# Patient Record
Sex: Female | Born: 1985
Health system: Southern US, Community
[De-identification: ages and names within clinical notes are randomized; demographics above are authoritative.]

## PROBLEM LIST (undated history)

## (undated) DIAGNOSIS — D649 Anemia, unspecified: Secondary | ICD-10-CM

## (undated) DIAGNOSIS — Z789 Other specified health status: Secondary | ICD-10-CM

## (undated) HISTORY — PX: EYE SURGERY: SHX253

## (undated) HISTORY — DX: Anemia, unspecified: D64.9

---

## 2009-07-30 ENCOUNTER — Emergency Department (HOSPITAL_COMMUNITY): Admission: EM | Admit: 2009-07-30 | Discharge: 2009-07-30 | Payer: Self-pay | Admitting: Emergency Medicine

## 2011-01-19 ENCOUNTER — Emergency Department (HOSPITAL_COMMUNITY): Payer: Self-pay

## 2011-01-19 ENCOUNTER — Emergency Department (HOSPITAL_COMMUNITY)
Admission: EM | Admit: 2011-01-19 | Discharge: 2011-01-19 | Disposition: A | Discharge status: Home / Self Care | Payer: Self-pay | Attending: Emergency Medicine | Admitting: Emergency Medicine

## 2011-01-19 DIAGNOSIS — Y9366 Activity, soccer: Secondary | ICD-10-CM | POA: Insufficient documentation

## 2011-01-19 DIAGNOSIS — M79609 Pain in unspecified limb: Secondary | ICD-10-CM | POA: Insufficient documentation

## 2011-01-19 DIAGNOSIS — W19XXXA Unspecified fall, initial encounter: Secondary | ICD-10-CM | POA: Insufficient documentation

## 2011-01-19 DIAGNOSIS — S6390XA Sprain of unspecified part of unspecified wrist and hand, initial encounter: Secondary | ICD-10-CM | POA: Insufficient documentation

## 2012-12-11 ENCOUNTER — Encounter (HOSPITAL_COMMUNITY): Payer: Self-pay | Admitting: *Deleted

## 2012-12-11 ENCOUNTER — Inpatient Hospital Stay (HOSPITAL_COMMUNITY)
Admission: AD | Admit: 2012-12-11 | Discharge: 2012-12-11 | Disposition: A | Discharge status: Home / Self Care | Payer: Managed Care, Other (non HMO) | Source: Ambulatory Visit | Attending: Obstetrics & Gynecology | Admitting: Obstetrics & Gynecology

## 2012-12-11 DIAGNOSIS — T7840XA Allergy, unspecified, initial encounter: Secondary | ICD-10-CM | POA: Insufficient documentation

## 2012-12-11 DIAGNOSIS — R21 Rash and other nonspecific skin eruption: Secondary | ICD-10-CM | POA: Insufficient documentation

## 2012-12-11 HISTORY — DX: Other specified health status: Z78.9

## 2012-12-11 NOTE — MAU Provider Note (Signed)
  History     CSN: [id number]  Arrival date and time: 12/11/12 2152   First Provider Initiated Contact with Patient 12/11/12 2309      Chief Complaint  Patient presents with  . Allergic Reaction   HPI Julie Mayo is a 27 y.o. G0P0 who presents to MAU today with complaint of rash. The patient states that the rash started ~ 2 days ago. It is pruritic and found in small patches of red raised bumps on her arms, back and chest. She denies SOB or swelling of the lips, tongue or throat. She denies GYN concerns and has no fever or N/V. She has not taken anything to relieve the itch or rash.   OB History   Grav Para Term Preterm Abortions TAB SAB Ect Mult Living   0               Past Medical History  Diagnosis Date  . Medical history non-contributory     Past Surgical History  Procedure Laterality Date  . Eye surgery      Family History  Problem Relation Age of Onset  . Cancer Maternal Grandmother   . Cancer Paternal Grandmother   . Diabetes Paternal Grandmother     History  Substance Use Topics  . Smoking status: Current Some Day Smoker    Types: Cigarettes  . Smokeless tobacco: Not on file  . Alcohol Use: Yes     Comment: occ    Allergies: No Known Allergies  No prescriptions prior to admission    Review of Systems  Constitutional: Negative for fever, chills and malaise/fatigue.  Gastrointestinal: Negative for nausea, vomiting, abdominal pain, diarrhea and constipation.  Genitourinary: Negative for dysuria, urgency and frequency.  Skin: Positive for itching and rash.   Physical Exam   Blood pressure 116/70, pulse 98, resp. rate 20, height 5' 3.5" (1.613 m), weight 113 lb (51.256 kg), last menstrual period 12/03/2012, SpO2 100.00%.  Physical Exam  Constitutional: She is oriented to person, place, and time. She appears well-developed and well-nourished. No distress.  HENT:  Head: Normocephalic and atraumatic.  Cardiovascular: Normal rate.    Respiratory: Effort normal.  Neurological: She is alert and oriented to person, place, and time.  Skin: Skin is warm and dry. Rash noted. There is erythema.  Three small patches of very small raised bumps with minimal surrounding erythema. No edema. No weeping.   Psychiatric: She has a normal mood and affect.   MAU Course  Procedures None  MDM Rash has appearance of allergic reaction. Patient states changes in "body oils" and products as well as exposure to different plant items etc.. At work.   Assessment and Plan  A: Allergic skin reaction  P: Discharge home Recommended Benadryl, zyrtec or claritan and hydrocortisone cream PRN Patient encouraged to seek medical attention with local ED or PCP if symptoms worsen. Dermatology referral may be necessary if symptoms worsen.  Return to MAU as needed or if her condition were to change or worsen  Julie Starr, PA-C  12/11/2012, 11:09 PM

## 2012-12-11 NOTE — Progress Notes (Signed)
Has slightly raised, red rash sporadically on trunk, arms, neck. Itches sometimes.

## 2012-12-11 NOTE — Progress Notes (Signed)
Written and verbal d/c instructions given and understanding voiced. 

## 2012-12-11 NOTE — MAU Note (Addendum)
Tried new body oil few days ago and now have itching and redness on neck and trunk. Also come in contact with diff. Plants at work. Symptoms started Thursday

## 2013-01-10 ENCOUNTER — Other Ambulatory Visit (HOSPITAL_COMMUNITY)
Admission: RE | Admit: 2013-01-10 | Discharge: 2013-01-10 | Disposition: A | Discharge status: Home / Self Care | Payer: Managed Care, Other (non HMO) | Source: Ambulatory Visit | Attending: Family Medicine | Admitting: Family Medicine

## 2013-01-10 ENCOUNTER — Other Ambulatory Visit: Payer: Self-pay | Admitting: Family Medicine

## 2013-01-10 DIAGNOSIS — Z124 Encounter for screening for malignant neoplasm of cervix: Secondary | ICD-10-CM | POA: Insufficient documentation

## 2013-07-18 ENCOUNTER — Emergency Department (HOSPITAL_COMMUNITY): Payer: Managed Care, Other (non HMO)

## 2013-07-18 ENCOUNTER — Emergency Department (HOSPITAL_COMMUNITY)
Admission: EM | Admit: 2013-07-18 | Discharge: 2013-07-18 | Disposition: A | Discharge status: Home / Self Care | Payer: Managed Care, Other (non HMO) | Attending: Emergency Medicine | Admitting: Emergency Medicine

## 2013-07-18 ENCOUNTER — Encounter (HOSPITAL_COMMUNITY): Payer: Self-pay | Admitting: Emergency Medicine

## 2013-07-18 DIAGNOSIS — S93409A Sprain of unspecified ligament of unspecified ankle, initial encounter: Secondary | ICD-10-CM | POA: Insufficient documentation

## 2013-07-18 DIAGNOSIS — F17200 Nicotine dependence, unspecified, uncomplicated: Secondary | ICD-10-CM | POA: Insufficient documentation

## 2013-07-18 DIAGNOSIS — W219XXA Striking against or struck by unspecified sports equipment, initial encounter: Secondary | ICD-10-CM | POA: Insufficient documentation

## 2013-07-18 DIAGNOSIS — Y9239 Other specified sports and athletic area as the place of occurrence of the external cause: Secondary | ICD-10-CM | POA: Insufficient documentation

## 2013-07-18 DIAGNOSIS — S93402A Sprain of unspecified ligament of left ankle, initial encounter: Secondary | ICD-10-CM

## 2013-07-18 DIAGNOSIS — Y9366 Activity, soccer: Secondary | ICD-10-CM | POA: Insufficient documentation

## 2013-07-18 DIAGNOSIS — IMO0002 Reserved for concepts with insufficient information to code with codable children: Secondary | ICD-10-CM | POA: Insufficient documentation

## 2013-07-18 MED ORDER — IBUPROFEN 400 MG PO TABS
800.0000 mg | ORAL_TABLET | Freq: Once | ORAL | Status: AC
  Administered 2013-07-18: 800 mg via ORAL
  Filled 2013-07-18: qty 2

## 2013-07-18 MED ORDER — IBUPROFEN 800 MG PO TABS: 800.0000 mg | ORAL_TABLET | Freq: Three times a day (TID) | ORAL | Status: DC | PRN

## 2013-07-18 NOTE — ED Notes (Signed)
PA at bedside.

## 2013-07-18 NOTE — ED Notes (Signed)
Pt states she was playing soccer yesterday when she was cleated in the right ankle, pt states she is able to bare minimal weight on her right foot. Pt states she cannot rotate her ankle, cannot flex or extend her right foot, or move the foot side to side.

## 2013-07-19 NOTE — ED Provider Notes (Signed)
Medical screening examination/treatment/procedure(s) were performed by non-physician practitioner and as supervising physician I was immediately available for consultation/collaboration.  EKG Interpretation   None        Teddy Pena K Montel Vanderhoof-Rasch, MD 07/19/13 0705 

## 2013-07-19 NOTE — ED Provider Notes (Signed)
CSN: [id number]     Arrival date & time 07/18/13  2201 History   First MD Initiated Contact with Patient 07/18/13 2251     Chief Complaint  Patient presents with  . Ankle Injury   HPI  History provided by the patient. Patient is a 27 year old female who presents with complaints of right ankle pain and injury. Patient states that another soccer player slid into her right ankle with their cleats and since that time she has had pain and swelling. Accident occurred yesterday. Pain has been worse today with more stiffness. Pain is worse with movements and walking. Has been tried to rest her foot and elevate with ice. She has not used any medications for her symptoms. Denies any weakness or numbness to the foot. No other aggravating or alleviating factors. No other associated symptoms.    Past Medical History  Diagnosis Date  . Medical history non-contributory    Past Surgical History  Procedure Laterality Date  . Eye surgery     Family History  Problem Relation Age of Onset  . Cancer Maternal Grandmother   . Cancer Paternal Grandmother   . Diabetes Paternal Grandmother    History  Substance Use Topics  . Smoking status: Current Some Day Smoker    Types: Cigarettes  . Smokeless tobacco: Not on file  . Alcohol Use: Yes     Comment: occ   OB History   Grav Para Term Preterm Abortions TAB SAB Ect Mult Living   0              Review of Systems  Neurological: Negative for weakness and numbness.  All other systems reviewed and are negative.    Allergies  Review of patient's allergies indicates no known allergies.  Home Medications   Current Outpatient Rx  Name  Route  Sig  Dispense  Refill  . ibuprofen (ADVIL,MOTRIN) 800 MG tablet   Oral   Take 1 tablet (800 mg total) by mouth every 8 (eight) hours as needed for pain.   30 tablet   0    BP 111/68  Pulse 77  Temp(Src) 97.8 F (36.6 C) (Oral)  Resp 16  Ht 5' 4.5" (1.638 m)  Wt 113 lb (51.256 kg)  BMI 19.1 kg/m2   SpO2 99%  LMP 07/11/2013 Physical Exam  Nursing note and vitals reviewed. Constitutional: She is oriented to person, place, and time. She appears well-developed and well-nourished. No distress.  HENT:  Head: Normocephalic.  Eyes: Conjunctivae are normal.  Cardiovascular: Normal rate and regular rhythm.   Pulmonary/Chest: Effort normal and breath sounds normal. No respiratory distress. She has no wheezes. She has no rales.  Musculoskeletal: Normal range of motion.  Small abrasions over the lateral right ankle. No bleeding. Mild swelling around the lateral malleolus. No gross deformity. No pain over the medial malleolus. No proximal fibular tenderness. No pain over the proximal fifth metatarsal. Normal dorsal pedal pulses sensations, movements and toes and capillary refill.  Neurological: She is alert and oriented to person, place, and time.  Skin: Skin is warm and dry. No rash noted.  Psychiatric: She has a normal mood and affect. Her behavior is normal.    ED Course  Procedures  Patient seen and evaluated. Patient appears in mild discomfort no acute distress.    Dg Ankle Complete Right  07/18/2013   CLINICAL DATA:  Lateral ankle pain  EXAM: RIGHT ANKLE - COMPLETE 3+ VIEW  COMPARISON:  None.  FINDINGS: Negative for acute fracture or  malalignment. Corticated ossicle at the tip of the medial malleolus, likely from remote avulsion fracture. No joint narrowing.  IMPRESSION: Negative for acute osseous injury.   Electronically Signed   By: Tiburcio Pea M.D.   On: 07/18/2013 23:04    EKG Interpretation   None       MDM   1. Ankle sprain, left, initial encounter        Angus Seller, PA-C 07/19/13 [phone]

## 2013-08-05 ENCOUNTER — Emergency Department (HOSPITAL_COMMUNITY)
Admission: EM | Admit: 2013-08-05 | Discharge: 2013-08-06 | Disposition: A | Discharge status: Home / Self Care | Payer: Managed Care, Other (non HMO) | Attending: Emergency Medicine | Admitting: Emergency Medicine

## 2013-08-05 ENCOUNTER — Encounter (HOSPITAL_COMMUNITY): Payer: Self-pay | Admitting: Emergency Medicine

## 2013-08-05 DIAGNOSIS — N649 Disorder of breast, unspecified: Secondary | ICD-10-CM

## 2013-08-05 DIAGNOSIS — N6489 Other specified disorders of breast: Secondary | ICD-10-CM | POA: Insufficient documentation

## 2013-08-05 DIAGNOSIS — F17200 Nicotine dependence, unspecified, uncomplicated: Secondary | ICD-10-CM | POA: Insufficient documentation

## 2013-08-05 NOTE — ED Notes (Signed)
C/o rt breast pain for 2 days.  She just finished her period

## 2013-08-06 MED ORDER — IBUPROFEN 600 MG PO TABS: 600.0000 mg | ORAL_TABLET | Freq: Four times a day (QID) | ORAL | Status: DC | PRN

## 2013-08-06 MED ORDER — TRAMADOL HCL 50 MG PO TABS: 50.0000 mg | ORAL_TABLET | Freq: Four times a day (QID) | ORAL | Status: DC | PRN

## 2013-08-06 NOTE — ED Provider Notes (Signed)
CSN: [id number]     Arrival date & time 08/05/13  2351 History   First MD Initiated Contact with Patient 08/06/13 [phone]     Chief Complaint  Patient presents with  . Breast Pain   (Consider location/radiation/quality/duration/timing/severity/associated sxs/prior Treatment) HPI This patient is a generally healthy young woman who presents with complaints of a tender and painful sensation over the right nipple. She noticed pain this morning while she was at work. This afternoon, she noticed that her right nipple appear to be swollen. She has not had a nipple discharge. Pain is intermittent. It is mild to moderate in severity. The patient denies any associated tenderness of the right breast behind the nipple region.   Patient denies history of similar symptoms. She has not appreciated any breast tissue masses. No fever. Her last menstrual period began 5 days ago.  Past Medical History  Diagnosis Date  . Medical history non-contributory    Past Surgical History  Procedure Laterality Date  . Eye surgery     Family History  Problem Relation Age of Onset  . Cancer Maternal Grandmother   . Cancer Paternal Grandmother   . Diabetes Paternal Grandmother    History  Substance Use Topics  . Smoking status: Current Some Day Smoker    Types: Cigarettes  . Smokeless tobacco: Not on file  . Alcohol Use: Yes     Comment: occ   OB History   Grav Para Term Preterm Abortions TAB SAB Ect Mult Living   0              Review of Systems  10 POINT ROS OBTAINED AND IS NEGATIVE WITH THE EXCEPTION OF SX NOTED ABOVE.  Allergies  Review of patient's allergies indicates no known allergies.  Home Medications   Current Outpatient Rx  Name  Route  Sig  Dispense  Refill  . ibuprofen (ADVIL,MOTRIN) 600 MG tablet   Oral   Take 1 tablet (600 mg total) by mouth every 6 (six) hours as needed.   30 tablet   0   . ibuprofen (ADVIL,MOTRIN) 800 MG tablet   Oral   Take 1 tablet (800 mg total) by mouth  every 8 (eight) hours as needed for pain.   30 tablet   0   . traMADol (ULTRAM) 50 MG tablet   Oral   Take 1 tablet (50 mg total) by mouth every 6 (six) hours as needed.   15 tablet   0    BP 116/77  Pulse 99  Temp(Src) 97.2 F (36.2 C) (Oral)  Resp 16  Wt 110 lb 6.4 oz (50.077 kg)  SpO2 98%  LMP 08/01/2013 Physical Exam Gen: well developed and well nourished appearing Head: NCAT Eyes: PERL, EOMI Nose: no epistaixis or rhinorrhea Mouth/throat: mucosa is moist and pink Neck: Normal to inspection Lungs: CTA B, no wheezing, rhonchi or rales CV: RRR, no murmur Breast exam: no lesions appreciated within the breast tissue on either side, no axillary masses or LAN, there is swelling of the right nipple with very small serous appearing fluid collection over the superolatera aspect of the right nipple, no nipple discharge, inversion, puckering, the aerola is not involved. Left nipple and aerola are normal to inspection.  Abd: soft, notender Back: normal to inspection Skin: warm and dry Neuro: CN ii-xii grossly intact, no focal deficits, normal speech Psyche; normal affect,  calm and cooperative.   ED Course  Procedures (including critical care time)   MDM   1. Breast lesion  Patient with benign appearing right nipple lesion. I have advised that she follow up with Dr. Donell Beers for further evaluation. The patient has contact information with Dr. Donell Beers and will call Monday to schedule appt. There are no signs of infectious process at this time. We will manage with warm compresses and NSAID along with Tramadol for breakthrough pain. Patient advised re: return precautions. She also has a PCP with whom she may follow up.     Brandt Loosen, MD 08/06/13 [phone]

## 2013-08-06 NOTE — Discharge Instructions (Signed)
WARM COMPRESSES  PLEASE RETURN TO THE ED IF YOU DEVELOP REDNESS, TENDERNESS, EXPANSION OF SWELLING.

## 2013-10-05 ENCOUNTER — Emergency Department (HOSPITAL_COMMUNITY)
Admission: EM | Admit: 2013-10-05 | Discharge: 2013-10-05 | Disposition: A | Discharge status: Home / Self Care | Payer: Self-pay | Attending: Emergency Medicine | Admitting: Emergency Medicine

## 2013-10-05 ENCOUNTER — Encounter (HOSPITAL_COMMUNITY): Payer: Self-pay | Admitting: Emergency Medicine

## 2013-10-05 DIAGNOSIS — F411 Generalized anxiety disorder: Secondary | ICD-10-CM | POA: Insufficient documentation

## 2013-10-05 DIAGNOSIS — F419 Anxiety disorder, unspecified: Secondary | ICD-10-CM

## 2013-10-05 DIAGNOSIS — R1110 Vomiting, unspecified: Secondary | ICD-10-CM | POA: Insufficient documentation

## 2013-10-05 DIAGNOSIS — F17200 Nicotine dependence, unspecified, uncomplicated: Secondary | ICD-10-CM | POA: Insufficient documentation

## 2013-10-05 DIAGNOSIS — Z3202 Encounter for pregnancy test, result negative: Secondary | ICD-10-CM | POA: Insufficient documentation

## 2013-10-05 DIAGNOSIS — N3090 Cystitis, unspecified without hematuria: Secondary | ICD-10-CM | POA: Insufficient documentation

## 2013-10-05 LAB — URINALYSIS, ROUTINE W REFLEX MICROSCOPIC
Bilirubin Urine: NEGATIVE
GLUCOSE, UA: NEGATIVE mg/dL
HGB URINE DIPSTICK: NEGATIVE
KETONES UR: NEGATIVE mg/dL
Leukocytes, UA: NEGATIVE
Nitrite: POSITIVE — AB
PH: 7.5 (ref 5.0–8.0)
PROTEIN: NEGATIVE mg/dL
Specific Gravity, Urine: 1.013 (ref 1.005–1.030)
Urobilinogen, UA: 0.2 mg/dL (ref 0.0–1.0)

## 2013-10-05 LAB — POCT PREGNANCY, URINE: PREG TEST UR: NEGATIVE

## 2013-10-05 LAB — CBC WITH DIFFERENTIAL/PLATELET
BASOS ABS: 0.1 10*3/uL (ref 0.0–0.1)
Basophils Relative: 1 % (ref 0–1)
Eosinophils Absolute: 0.1 10*3/uL (ref 0.0–0.7)
Eosinophils Relative: 2 % (ref 0–5)
HEMATOCRIT: 41.6 % (ref 36.0–46.0)
Hemoglobin: 14.4 g/dL (ref 12.0–15.0)
LYMPHS PCT: 31 % (ref 12–46)
Lymphs Abs: 1.8 10*3/uL (ref 0.7–4.0)
MCH: 32.2 pg (ref 26.0–34.0)
MCHC: 34.6 g/dL (ref 30.0–36.0)
MCV: 93.1 fL (ref 78.0–100.0)
Monocytes Absolute: 0.5 10*3/uL (ref 0.1–1.0)
Monocytes Relative: 9 % (ref 3–12)
NEUTROS ABS: 3.3 10*3/uL (ref 1.7–7.7)
NEUTROS PCT: 58 % (ref 43–77)
PLATELETS: 278 10*3/uL (ref 150–400)
RBC: 4.47 MIL/uL (ref 3.87–5.11)
RDW: 11.6 % (ref 11.5–15.5)
WBC: 5.7 10*3/uL (ref 4.0–10.5)

## 2013-10-05 LAB — COMPREHENSIVE METABOLIC PANEL
ALBUMIN: 4.4 g/dL (ref 3.5–5.2)
ALT: 9 U/L (ref 0–35)
AST: 18 U/L (ref 0–37)
Alkaline Phosphatase: 56 U/L (ref 39–117)
BILIRUBIN TOTAL: 0.7 mg/dL (ref 0.3–1.2)
BUN: 8 mg/dL (ref 6–23)
CHLORIDE: 99 meq/L (ref 96–112)
CO2: 24 meq/L (ref 19–32)
Calcium: 9.5 mg/dL (ref 8.4–10.5)
Creatinine, Ser: 0.71 mg/dL (ref 0.50–1.10)
GFR calc Af Amer: >90 mL/min (ref >90)
Glucose, Bld: 82 mg/dL (ref 70–99)
POTASSIUM: 3.5 meq/L — AB (ref 3.7–5.3)
SODIUM: 136 meq/L — AB (ref 137–147)
Total Protein: 7.6 g/dL (ref 6.0–8.3)

## 2013-10-05 LAB — LIPASE, BLOOD: Lipase: 14 U/L (ref 11–59)

## 2013-10-05 LAB — URINE MICROSCOPIC-ADD ON

## 2013-10-05 MED ORDER — LORAZEPAM 1 MG PO TABS: 0.5000 mg | ORAL_TABLET | Freq: Three times a day (TID) | ORAL | Status: AC | PRN

## 2013-10-05 MED ORDER — SULFAMETHOXAZOLE-TRIMETHOPRIM 800-160 MG PO TABS: 1.0000 | ORAL_TABLET | Freq: Two times a day (BID) | ORAL | Status: DC

## 2013-10-05 MED ORDER — LORAZEPAM 1 MG PO TABS
1.0000 mg | ORAL_TABLET | Freq: Once | ORAL | Status: AC
  Administered 2013-10-05: 1 mg via ORAL
  Filled 2013-10-05: qty 1
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
## 2013-10-05 NOTE — Discharge Instructions (Signed)
Please follow up with your PCP regarding today's visit. You will need your Thyroid function checked as this can be a medical cause for anxiety. We are unable to test this in the emergency room.  Urinary Tract Infection Urinary tract infections (UTIs) can develop anywhere along your urinary tract. Your urinary tract is your body's drainage system for removing wastes and extra water. Your urinary tract includes two kidneys, two ureters, a bladder, and a urethra. Your kidneys are a pair of bean-shaped organs. Each kidney is about the size of your fist. They are located below your ribs, one on each side of your spine. CAUSES Infections are caused by microbes, which are microscopic organisms, including fungi, viruses, and bacteria. These organisms are so small that they can only be seen through a microscope. Bacteria are the microbes that most commonly cause UTIs. SYMPTOMS  Symptoms of UTIs may vary by age and gender of the patient and by the location of the infection. Symptoms in young women typically include a frequent and intense urge to urinate and a painful, burning feeling in the bladder or urethra during urination. Older women and men are more likely to be tired, shaky, and weak and have muscle aches and abdominal pain. A fever may mean the infection is in your kidneys. Other symptoms of a kidney infection include pain in your back or sides below the ribs, nausea, and vomiting. DIAGNOSIS To diagnose a UTI, your caregiver will ask you about your symptoms. Your caregiver also will ask to provide a urine sample. The urine sample will be tested for bacteria and white blood cells. White blood cells are made by your body to help fight infection. TREATMENT  Typically, UTIs can be treated with medication. Because most UTIs are caused by a bacterial infection, they usually can be treated with the use of antibiotics. The choice of antibiotic and length of treatment depend on your symptoms and the type of bacteria  causing your infection. HOME CARE INSTRUCTIONS  If you were prescribed antibiotics, take them exactly as your caregiver instructs you. Finish the medication even if you feel better after you have only taken some of the medication.  Drink enough water and fluids to keep your urine clear or pale yellow.  Avoid caffeine, tea, and carbonated beverages. They tend to irritate your bladder.  Empty your bladder often. Avoid holding urine for long periods of time.  Empty your bladder before and after sexual intercourse.  After a bowel movement, women should cleanse from front to back. Use each tissue only once. SEEK MEDICAL CARE IF:   You have back pain.  You develop a fever.  Your symptoms do not begin to resolve within 3 days. SEEK IMMEDIATE MEDICAL CARE IF:   You have severe back pain or lower abdominal pain.  You develop chills.  You have nausea or vomiting.  You have continued burning or discomfort with urination. MAKE SURE YOU:   Understand these instructions.  Will watch your condition.  Will get help right away if you are not doing well or get worse. Document Released: 06/18/2005 Document Revised: 03/09/2012 Document Reviewed: 10/17/2011 [facility] Patient Information 2014 [place], [place].  Panic Attacks Panic attacks are sudden, short-livedsurges of severe anxiety, fear, or discomfort. They may occur for no reason when you are relaxed, when you are anxious, or when you are sleeping. Panic attacks may occur for a number of reasons:   Healthy people occasionally have panic attacks in extreme, life-threatening situations, such as war or natural disasters.  Normal anxiety is a protective mechanism of the body that helps us react to danger (fight or flight response).  Panic attacks are often seen with anxiety disorders, such as panic disorder, social anxiety disorder, generalized anxiety disorder, and phobias. Anxiety disorders cause excessive or uncontrollable anxiety. They may  interfere with your relationships or other life activities.  Panic attacks are sometimes seen with other mental illnesses such as depression and posttraumatic stress disorder.  Certain medical conditions, prescription medicines, and drugs of abuse can cause panic attacks. SYMPTOMS  Panic attacks start suddenly, peak within 20 minutes, and are accompanied by four or more of the following symptoms:  Pounding heart or fast heart rate (palpitations).  Sweating.  Trembling or shaking.  Shortness of breath or feeling smothered.  Feeling choked.  Chest pain or discomfort.  Nausea or strange feeling in your stomach.  Dizziness, lightheadedness, or feeling like you will faint.  Chills or hot flushes.  Numbness or tingling in your lips or hands and feet.  Feeling that things are not real or feeling that you are not yourself.  Fear of losing control or going crazy.  Fear of dying. Some of these symptoms can mimic serious medical conditions. For example, you may think you are having a heart attack. Although panic attacks can be very scary, they are not life threatening. DIAGNOSIS  Panic attacks are diagnosed through an assessment by your health care provider. Your health care provider will ask questions about your symptoms, such as where and when they occurred. Your health care provider will also ask about your medical history and use of alcohol and drugs, including prescription medicines. Your health care provider may order blood tests or other studies to rule out a serious medical condition. Your health care provider may refer you to a mental health professional for further evaluation. TREATMENT   Most healthy people who have one or two panic attacks in an extreme, life-threatening situation will not require treatment.  The treatment for panic attacks associated with anxiety disorders or other mental illness typically involves counseling with a mental health professional, medicine, or a  combination of both. Your health care provider will help determine what treatment is best for you.  Panic attacks due to physical illness usually goes away with treatment of the illness. If prescription medicine is causing panic attacks, talk with your health care provider about stopping the medicine, decreasing the dose, or substituting another medicine.  Panic attacks due to alcohol or drug abuse goes away with abstinence. Some adults need professional help in order to stop drinking or using drugs. HOME CARE INSTRUCTIONS   Take all your medicines as prescribed.   Check with your health care provider before starting new prescription or over-the-counter medicines.  Keep all follow up appointments with your health care provider. SEEK MEDICAL CARE IF:  You are not able to take your medicines as prescribed.  Your symptoms do not improve or get worse. SEEK IMMEDIATE MEDICAL CARE IF:   You experience panic attack symptoms that are different than your usual symptoms.  You have serious thoughts about hurting yourself or others.  You are taking medicine for panic attacks and have a serious side effect. MAKE SURE YOU:  Understand these instructions.  Will watch your condition.  Will get help right away if you are not doing well or get worse. Document Released: 09/08/2005 Document Revised: 06/29/2013 Document Reviewed: 04/22/2013 [facility] Patient Information 2014 [place], [place].

## 2013-10-05 NOTE — ED Notes (Signed)
Pt expressing thoughts of anxiety and nausea. See triage. Denies pain and vomiting

## 2013-10-08 NOTE — ED Provider Notes (Signed)
Medical screening examination/treatment/procedure(s) were performed by non-physician practitioner and as supervising physician I was immediately available for consultation/collaboration.  EKG Interpretation   None         Sherena Machorro, MD 10/08/13 0715 

## 2013-12-02 ENCOUNTER — Other Ambulatory Visit: Payer: Self-pay | Admitting: Family Medicine

## 2013-12-02 ENCOUNTER — Other Ambulatory Visit (HOSPITAL_COMMUNITY)
Admission: RE | Admit: 2013-12-02 | Discharge: 2013-12-02 | Disposition: A | Discharge status: Home / Self Care | Payer: 59 | Source: Ambulatory Visit | Attending: Family Medicine | Admitting: Family Medicine

## 2013-12-02 DIAGNOSIS — Z113 Encounter for screening for infections with a predominantly sexual mode of transmission: Secondary | ICD-10-CM | POA: Insufficient documentation

## 2013-12-02 DIAGNOSIS — Z124 Encounter for screening for malignant neoplasm of cervix: Secondary | ICD-10-CM | POA: Insufficient documentation

## 2014-07-18 ENCOUNTER — Encounter: Payer: Self-pay | Admitting: Gastroenterology

## 2014-08-31 ENCOUNTER — Encounter: Payer: Self-pay | Admitting: Gastroenterology

## 2014-09-19 ENCOUNTER — Ambulatory Visit (INDEPENDENT_AMBULATORY_CARE_PROVIDER_SITE_OTHER): Payer: 59 | Admitting: Gastroenterology

## 2014-09-19 ENCOUNTER — Encounter: Payer: Self-pay | Admitting: Gastroenterology

## 2014-09-19 ENCOUNTER — Other Ambulatory Visit (INDEPENDENT_AMBULATORY_CARE_PROVIDER_SITE_OTHER): Payer: 59

## 2014-09-19 VITALS — BP 90/60 | HR 60 | Ht 63.0 in | Wt 118.4 lb

## 2014-09-19 DIAGNOSIS — K5900 Constipation, unspecified: Secondary | ICD-10-CM

## 2014-09-19 LAB — CBC WITH DIFFERENTIAL/PLATELET
BASOS ABS: 0 10*3/uL (ref 0.0–0.1)
Basophils Relative: 0.9 % (ref 0.0–3.0)
EOS ABS: 0.1 10*3/uL (ref 0.0–0.7)
Eosinophils Relative: 2.9 % (ref 0.0–5.0)
HCT: 40 % (ref 36.0–46.0)
Hemoglobin: 13.1 g/dL (ref 12.0–15.0)
LYMPHS ABS: 1.3 10*3/uL (ref 0.7–4.0)
LYMPHS PCT: 26.5 % (ref 12.0–46.0)
MCHC: 32.8 g/dL (ref 30.0–36.0)
MCV: 94.8 fl (ref 78.0–100.0)
MONOS PCT: 5.9 % (ref 3.0–12.0)
Monocytes Absolute: 0.3 10*3/uL (ref 0.1–1.0)
Neutro Abs: 3.2 10*3/uL (ref 1.4–7.7)
Neutrophils Relative %: 63.8 % (ref 43.0–77.0)
PLATELETS: 241 10*3/uL (ref 150.0–400.0)
RBC: 4.22 Mil/uL (ref 3.87–5.11)
RDW: 12.5 % (ref 11.5–15.5)
WBC: 5 10*3/uL (ref 4.0–10.5)

## 2014-09-19 LAB — COMPREHENSIVE METABOLIC PANEL
ALT: 9 U/L (ref 0–35)
AST: 18 U/L (ref 0–37)
Albumin: 3.9 g/dL (ref 3.5–5.2)
Alkaline Phosphatase: 41 U/L (ref 39–117)
BILIRUBIN TOTAL: 1.1 mg/dL (ref 0.2–1.2)
BUN: 8 mg/dL (ref 6–23)
CHLORIDE: 106 meq/L (ref 96–112)
CO2: 27 meq/L (ref 19–32)
Calcium: 8.7 mg/dL (ref 8.4–10.5)
Creatinine, Ser: 0.7 mg/dL (ref 0.4–1.2)
GFR: 133.94 mL/min (ref >60.00)
Glucose, Bld: 80 mg/dL (ref 70–99)
Potassium: 3.8 mEq/L (ref 3.5–5.1)
SODIUM: 138 meq/L (ref 135–145)
TOTAL PROTEIN: 6.5 g/dL (ref 6.0–8.3)

## 2014-09-19 LAB — TSH: TSH: 0.62 u[IU]/mL (ref 0.35–4.50)

## 2014-09-19 NOTE — Patient Instructions (Addendum)
One of your biggest health concerns is your smoking.  This increases your risk for most cancers and serious cardiovascular diseases such as strokes, heart attacks.  You should try your best to stop.  If you need assistance, please contact your PCP or Smoking Cessation Class at [facility] [phone]) or [facility] (1-800-QUIT-NOW). Please start taking citrucel (orange flavored) powder fiber supplement.  This may cause some bloating at first but that usually goes away. Begin with a small spoonful and work your way up to a large, heaping spoonful daily over a week. You will have labs checked today in the basement lab.  Please head down after you check out with the front desk  (cbc, cmet, tsh, celiac panel). You will be set up for a flexible sigmoidoscopy for your constipation.

## 2014-09-19 NOTE — Progress Notes (Signed)
HPI: This is a   very pleasant 28 year old woman whom I am meeting for the first time today.  abd pains perimbilical.  Constipated all her life.  Probably worse in past couple years.  Has to really push and strain, takes a long time to pass stool, can be painful. Can see blood in her stools.    She tried fiber gummies, for couple days, no real improvement.  Overall her weight is up a bit recently.  Brother with celiac sprue. She believes biopsy confirmed  Review of systems: Pertinent positive and negative review of systems were noted in the above HPI section. Complete review of systems was performed and was otherwise normal.    Past Medical History  Diagnosis Date  . Medical history non-contributory     Past Surgical History  Procedure Laterality Date  . Eye surgery      Current Outpatient Prescriptions  Medication Sig Dispense Refill  . ibuprofen (ADVIL,MOTRIN) 200 MG tablet Take 200 mg by mouth every 6 (six) hours as needed.    Marland Kitchen. LORazepam (ATIVAN) 1 MG tablet Take 0.5-1 tablets (0.5-1 mg total) by mouth 3 (three) times daily as needed for anxiety. 10 tablet 0   No current facility-administered medications for this visit.    Allergies as of 09/19/2014  . (No Known Allergies)    Family History  Problem Relation Age of Onset  . Cancer Maternal Grandmother   . Cancer Paternal Grandmother   . Diabetes Paternal Grandmother   . Colon cancer Paternal Grandmother   . Celiac disease Brother     History   Social History  . Marital Status: Single    Spouse Name: N/A    Number of Children: N/A  . Years of Education: N/A   Occupational History  . [employer]    Social History Main Topics  . Smoking status: Current Some Day Smoker    Types: Cigarettes  . Smokeless tobacco: Never Used  . Alcohol Use: 0.0 oz/week    0 Not specified per week     Comment: occ  . Drug Use: No  . Sexual Activity: Yes    Birth Control/ Protection: None   Other Topics Concern  . Not on  file   Social History Narrative       Physical Exam: BP 90/60 mmHg  Pulse 60  Ht 5\' 3"  (1.6 m)  Wt 118 lb 6.4 oz (53.706 kg)  BMI 20.98 kg/m2  LMP 09/11/2014 (Approximate) Constitutional: generally well-appearing Psychiatric: alert and oriented x3 Eyes: extraocular movements intact Mouth: oral pharynx moist, no lesions Neck: supple no lymphadenopathy Cardiovascular: heart regular rate and rhythm Lungs: clear to auscultation bilaterally Abdomen: soft, nontender, nondistended, no obvious ascites, no peritoneal signs, normal bowel sounds Extremities: no lower extremity edema bilaterally Skin: no lesions on visible extremities    Assessment and plan: 28 y.o. female with  chronic constipation, lower abdominal pains that improved with bowel movements  I do suspect that her abdominal discomforts are related to her chronic constipation. She has never really given fiber supplements to try longer than a day or 2. She is going to start Citrucel fiber supplementation. She will have a basic set of labs including CBC, complete metabolic profile, thyroid testing. I am also going to check celiac panel since her brother has celiac sprue and she has quite a lot of difficulty gaining weight. Lastly she will undergo flexible sigmoidoscopy to rule out structural causes which I think are unlikely but since she does see intermittent blood  I think we should proceed.

## 2014-09-20 LAB — CELIAC PANEL 10
Endomysial Screen: NEGATIVE
GLIADIN IGA: 3 U (ref <20)
GLIADIN IGG: 2 U (ref <20)
IGA: 150 mg/dL (ref 69–380)
TISSUE TRANSGLUTAMINASE AB, IGA: 1 U/mL (ref <4)
Tissue Transglut Ab: 1 U/mL (ref <6)

## 2014-10-31 ENCOUNTER — Encounter: Payer: Self-pay | Admitting: Gastroenterology

## 2014-10-31 ENCOUNTER — Ambulatory Visit (AMBULATORY_SURGERY_CENTER): Payer: 59 | Admitting: Gastroenterology

## 2014-10-31 VITALS — BP 115/70 | HR 69 | Temp 98.3°F | Resp 26 | Ht 63.0 in | Wt 118.0 lb

## 2014-10-31 DIAGNOSIS — K5900 Constipation, unspecified: Secondary | ICD-10-CM

## 2014-10-31 MED ORDER — SODIUM CHLORIDE 0.9 % IV SOLN: 500.0000 mL | INTRAVENOUS | Status: DC

## 2014-10-31 NOTE — Progress Notes (Signed)
Report to PACU, RN, vss, BBS= Clear.  

## 2014-10-31 NOTE — Op Note (Signed)
[facility] [address] [place], [id number]   FLEX SIGMOIDOSCOPY PROCEDURE REPORT  PATIENT: Julie Mayo, Julie Mayo  MR#: [id number] BIRTHDATE: [birth year] , 28  yrs. old GENDER: female ENDOSCOPIST: Rachael Feeaniel P Laterra Lubinski, MD REFERRED HQ:IONGEBY:Dibas Koirala, MD PROCEDURE DATE:  10/31/2014 PROCEDURE:   Sigmoidoscopy, diagnostic INDICATIONS:constipation. MEDICATIONS: Monitored anesthesia care and Propofol 200 mg IV  DESCRIPTION OF PROCEDURE:    Physical exam was performed.  Informed consent was obtained from the patient after explaining the benefits, risks, and alternatives to procedure.  The patient was connected to monitor and placed in left lateral position. Continuous oxygen was provided by nasal cannula and IV medicine administered through an indwelling cannula.  After administration of sedation and rectal exam, the patients rectum was intubated and the LB PFC-[id number]  colonoscope was advanced under direct visualization to the cecum.  The scope was removed slowly by carefully examining the color, texture, anatomy, and integrity mucosa on the way out.  The patient was recovered in endoscopy and discharged home in satisfactory condition.    COLON FINDINGS: Normal examination to the splenic flexure. PREP QUALITY: The overall prep quality was adequate. COMPLICATIONS: None  ENDOSCOPIC IMPRESSION: Normal examination to the splenic flexure No polyps or cancers  RECOMMENDATIONS: Restart/continue once daily fiber supplement (fiber gummies).  Try to stay hydrated.    _______________________________ eSigned:  Rachael Feeaniel P Xochilth Standish, MD 10/31/2014 3:20 PM      The ICD and CPT codes recommended by this software are interpretations from the data that the clinical staff has captured with the software.  The verification of the translation of this report to the ICD and CPT codes and modifiers is the sole responsibility of the health care institution and practicing physician  where this report was generated.  [employer] will not be held responsible for the validity of the ICD and CPT codes included on this report.  AMA assumes no liability for data contained or not contained herein. CPT is a Publishing rights managerregistered trademark of the [employer].

## 2014-10-31 NOTE — Patient Instructions (Signed)
Discharge instructions given. Normal exam. Resume previous medications. YOU HAD AN ENDOSCOPIC PROCEDURE TODAY AT THE [facility]: Refer to the procedure report that was given to you for any specific questions about what was found during the examination.  If the procedure report does not answer your questions, please call your gastroenterologist to clarify.  If you requested that your care partner not be given the details of your procedure findings, then the procedure report has been included in a sealed envelope for you to review at your convenience later.  YOU SHOULD EXPECT: Some feelings of bloating in the abdomen. Passage of more gas than usual.  Walking can help get rid of the air that was put into your GI tract during the procedure and reduce the bloating. If you had a lower endoscopy (such as a colonoscopy or flexible sigmoidoscopy) you may notice spotting of blood in your stool or on the toilet paper. If you underwent a bowel prep for your procedure, then you may not have a normal bowel movement for a few days.  DIET: Your first meal following the procedure should be a light meal and then it is ok to progress to your normal diet.  A half-sandwich or bowl of soup is an example of a good first meal.  Heavy or fried foods are harder to digest and may make you feel nauseous or bloated.  Likewise meals heavy in dairy and vegetables can cause extra gas to form and this can also increase the bloating.  Drink plenty of fluids but you should avoid alcoholic beverages for 24 hours.  ACTIVITY: Your care partner should take you home directly after the procedure.  You should plan to take it easy, moving slowly for the rest of the day.  You can resume normal activity the day after the procedure however you should NOT DRIVE or use heavy machinery for 24 hours (because of the sedation medicines used during the test).    SYMPTOMS TO REPORT IMMEDIATELY: A gastroenterologist can be reached at any hour.   During normal business hours, 8:30 AM to 5:00 PM Monday through Friday, call [phone].  After hours and on weekends, please call the GI answering service at [phone] who will take a message and have the physician on call contact you.   Following lower endoscopy (colonoscopy or flexible sigmoidoscopy):  Excessive amounts of blood in the stool  Significant tenderness or worsening of abdominal pains  Swelling of the abdomen that is new, acute  Fever of 100F or higher  FOLLOW UP: If any biopsies were taken you will be contacted by phone or by letter within the next 1-3 weeks.  Call your gastroenterologist if you have not heard about the biopsies in 3 weeks.  Our staff will call the home number listed on your records the next business day following your procedure to check on you and address any questions or concerns that you may have at that time regarding the information given to you following your procedure. This is a courtesy call and so if there is no answer at the home number and we have not heard from you through the emergency physician on call, we will assume that you have returned to your regular daily activities without incident.  SIGNATURES/CONFIDENTIALITY: You and/or your care partner have signed paperwork which will be entered into your electronic medical record.  These signatures attest to the fact that that the information above on your After Visit Summary has been reviewed and is understood.    Full responsibility of the confidentiality of this discharge information lies with you and/or your care-partner. 

## 2014-11-01 ENCOUNTER — Telehealth: Payer: Self-pay | Admitting: *Deleted

## 2014-11-01 NOTE — Telephone Encounter (Signed)
  Follow up Call-  Call back number 10/31/2014  Post procedure Call Back phone  # [phone]  Permission to leave phone message Yes   [facility]

## 2017-01-06 ENCOUNTER — Other Ambulatory Visit: Payer: Self-pay | Admitting: Family Medicine

## 2017-01-06 ENCOUNTER — Other Ambulatory Visit (HOSPITAL_COMMUNITY)
Admission: RE | Admit: 2017-01-06 | Discharge: 2017-01-06 | Disposition: A | Discharge status: Home / Self Care | Payer: 59 | Source: Ambulatory Visit | Attending: Family Medicine | Admitting: Family Medicine

## 2017-01-06 DIAGNOSIS — Z1151 Encounter for screening for human papillomavirus (HPV): Secondary | ICD-10-CM | POA: Insufficient documentation

## 2017-01-06 DIAGNOSIS — Z113 Encounter for screening for infections with a predominantly sexual mode of transmission: Secondary | ICD-10-CM | POA: Insufficient documentation

## 2017-01-06 DIAGNOSIS — Z01419 Encounter for gynecological examination (general) (routine) without abnormal findings: Secondary | ICD-10-CM | POA: Diagnosis present

## 2017-01-09 LAB — CYTOLOGY - PAP
CHLAMYDIA, DNA PROBE: NEGATIVE
DIAGNOSIS: NEGATIVE
HPV: NOT DETECTED
Neisseria Gonorrhea: NEGATIVE

## 2019-12-30 ENCOUNTER — Other Ambulatory Visit (HOSPITAL_COMMUNITY)
Admission: RE | Admit: 2019-12-30 | Discharge: 2019-12-30 | Disposition: A | Discharge status: Home / Self Care | Payer: 59 | Source: Ambulatory Visit | Attending: Family Medicine | Admitting: Family Medicine

## 2019-12-30 ENCOUNTER — Other Ambulatory Visit: Payer: Self-pay | Admitting: Family Medicine

## 2019-12-30 DIAGNOSIS — Z01411 Encounter for gynecological examination (general) (routine) with abnormal findings: Secondary | ICD-10-CM | POA: Diagnosis present

## 2020-01-04 LAB — CYTOLOGY - PAP
Comment: NEGATIVE
Diagnosis: NEGATIVE
High risk HPV: NEGATIVE

## 2020-04-19 ENCOUNTER — Other Ambulatory Visit: Payer: Self-pay

## 2020-04-19 ENCOUNTER — Ambulatory Visit (INDEPENDENT_AMBULATORY_CARE_PROVIDER_SITE_OTHER): Payer: 59

## 2020-04-19 ENCOUNTER — Ambulatory Visit (HOSPITAL_COMMUNITY)
Admission: EM | Admit: 2020-04-19 | Discharge: 2020-04-19 | Disposition: A | Discharge status: Home / Self Care | Payer: 59

## 2020-04-19 ENCOUNTER — Encounter (HOSPITAL_COMMUNITY): Payer: Self-pay | Admitting: Emergency Medicine

## 2020-04-19 DIAGNOSIS — M25532 Pain in left wrist: Secondary | ICD-10-CM | POA: Diagnosis not present

## 2020-04-19 DIAGNOSIS — W19XXXA Unspecified fall, initial encounter: Secondary | ICD-10-CM

## 2020-04-19 DIAGNOSIS — S63502A Unspecified sprain of left wrist, initial encounter: Secondary | ICD-10-CM | POA: Diagnosis not present

## 2020-04-19 MED ORDER — IBUPROFEN 800 MG PO TABS: 800.0000 mg | ORAL_TABLET | Freq: Once | ORAL | Status: DC

## 2020-04-19 MED ORDER — IBUPROFEN 800 MG PO TABS
ORAL_TABLET | ORAL | Status: AC
  Filled 2020-04-19: qty 1

## 2020-04-19 NOTE — ED Provider Notes (Signed)
[facility]    CSN: [id number] Arrival date & time: 04/19/20  9833      History   Chief Complaint Chief Complaint  Patient presents with  . Fall  . Wrist Injury    HPI Julie Mayo is a 34 y.o. female.   34 yr old AA female presents to UC today w cc of left lateral wrist pain after FOOSH injury,playing soccer yesterday. Used ice to area, decreased ROM, d/t pain  The history is provided by the patient. No language interpreter was used.    Past Medical History:  Diagnosis Date  . Anemia   . Medical history non-contributory     Patient Active Problem List   Diagnosis Date Noted  . Left wrist sprain, initial encounter 04/19/2020  . Fall 04/19/2020    Past Surgical History:  Procedure Laterality Date  . EYE SURGERY      OB History    Gravida  0   Para      Term      Preterm      AB      Living        SAB      TAB      Ectopic      Multiple      Live Births               Home Medications    Prior to Admission medications   Medication Sig Start Date End Date Taking? Authorizing Provider  ALPRAZolam (XANAX PO) Take by mouth.    [provider]  ibuprofen (ADVIL,MOTRIN) 200 MG tablet Take 200 mg by mouth every 6 (six) hours as needed.    [provider]  LORazepam (ATIVAN) 1 MG tablet Take 0.5-1 tablets (0.5-1 mg total) by mouth 3 (three) times daily as needed for anxiety. 10/05/13   Arthor Captain, PA-C    Family History Family History  Problem Relation Age of Onset  . Cancer Maternal Grandmother   . Cancer Paternal Grandmother   . Diabetes Paternal Grandmother   . Colon cancer Paternal Grandmother   . Celiac disease Brother   . Stomach cancer Neg Hx     Social History Social History   Tobacco Use  . Smoking status: Current Some Day Smoker    Packs/day: 0.25    Types: Cigarettes  . Smokeless tobacco: Never Used  Substance Use Topics  . Alcohol use: Yes    Alcohol/week: 0.0 standard drinks     Comment: occ  . Drug use: No     Allergies   Patient has no known allergies.   Review of Systems Review of Systems  Constitutional: Positive for activity change. Negative for fever.  HENT: Negative.   Eyes: Negative.   Respiratory: Negative for shortness of breath.   Cardiovascular: Negative for chest pain.  Gastrointestinal: Negative for abdominal pain.  Endocrine: Negative.   Genitourinary: Negative.   Musculoskeletal: Positive for joint swelling and myalgias. Negative for back pain and gait problem.  Skin: Negative for color change and wound.  Neurological: Negative for headaches.  Hematological: Negative.   Psychiatric/Behavioral: Negative.   All other systems reviewed and are negative.    Physical Exam Triage Vital Signs ED Triage Vitals  Enc Vitals Group     BP 04/19/20 1110 105/75     Pulse Rate 04/19/20 1110 73     Resp 04/19/20 1110 18     Temp 04/19/20 1110 98.7 F (37.1 C)     Temp Source  04/19/20 1110 Oral     SpO2 04/19/20 1110 100 %     Weight --      Height --      Head Circumference --      Peak Flow --      Pain Score 04/19/20 1105 6     Pain Loc --      Pain Edu? --      Excl. in GC? --    No data found.  Updated Vital Signs BP 105/75 (BP Location: Right Arm)   Pulse 73   Temp 98.7 F (37.1 C) (Oral)   Resp 18   LMP 03/20/2020   SpO2 100%    Physical Exam Vitals and nursing note reviewed.  Constitutional:      General: She is not in acute distress.    Appearance: She is well-developed. She is not ill-appearing or toxic-appearing.  HENT:     Head: Normocephalic.     Right Ear: Tympanic membrane normal.     Left Ear: Tympanic membrane normal.     Nose: Nose normal.     Mouth/Throat:     Pharynx: Uvula midline.  Eyes:     Pupils: Pupils are equal, round, and reactive to light.  Neck:     Trachea: Trachea normal.     Meningeal: Brudzinski's sign and Kernig's sign absent.  Cardiovascular:     Rate and Rhythm: Normal rate and  regular rhythm.  Pulmonary:     Effort: Pulmonary effort is normal.     Breath sounds: Normal breath sounds.  Musculoskeletal:     Right shoulder: No swelling, deformity, effusion, laceration, bony tenderness or crepitus. Normal range of motion. Normal strength. Normal pulse.     Left wrist: Swelling, tenderness and bony tenderness present. No deformity or snuff box tenderness. Decreased range of motion.     Cervical back: Normal range of motion. No muscular tenderness.     Comments: C/o left lateral wrist pain, +TTP , deceased ROM d/t pain  Skin:    General: Skin is warm and dry.     Findings: No rash.  Neurological:     Mental Status: She is alert and oriented to person, place, and time.     GCS: GCS eye subscore is 4. GCS verbal subscore is 5. GCS motor subscore is 6.  Psychiatric:        Speech: Speech normal.        Behavior: Behavior normal.      UC Treatments / Results  Labs (all labs ordered are listed, but only abnormal results are displayed) Labs Reviewed - No data to display  EKG   Radiology DG Wrist Complete Left  Result Date: 04/19/2020 CLINICAL DATA:  Pain following fall EXAM: LEFT WRIST - COMPLETE 3+ VIEW COMPARISON:  None. FINDINGS: Frontal, oblique, lateral, and ulnar deviation scaphoid images were obtained. No fracture or dislocation. Joint spaces appear normal. No erosive change. IMPRESSION: No fracture or dislocation.  No evident arthropathy. Electronically Signed   By: Bretta Bang III M.D.   On: 04/19/2020 11:30    Procedures Procedures (including critical care time)  Medications Ordered in UC Medications  ibuprofen (ADVIL) tablet 800 mg (800 mg Oral Not Given 04/19/20 1204)    Initial Impression / Assessment and Plan / UC Course  I have reviewed the triage vital signs and the nursing notes.  Pertinent labs & imaging results that were available during my care of the patient were reviewed by me and considered in my  medical decision making (see  chart for details).     Rest,ice,elevate,wear cock up velcro wrist splint.  May take tylenol,ibuporfen as label directed for discomfort. Follow up with Ortho in 1 week if symptoms persist  Final Clinical Impressions(s) / UC Diagnoses   Final diagnoses:  Left wrist sprain, initial encounter  Fall, initial encounter     Discharge Instructions      Rest,ice,eelvate,wear cock up velcro wrist splitn. May take tylenol,ibuporfen as label directed for discomfort. Follow up with Ortho in 1 week if symptoms persist    ED Prescriptions    None     PDMP not reviewed this encounter.   Clancy Gourd, NP 04/19/20 1243

## 2020-04-19 NOTE — Discharge Instructions (Addendum)
  Rest,ice,eelvate,wear cock up velcro wrist splitn. May take tylenol,ibuporfen as label directed for discomfort. Follow up with Ortho in 1 week if symptoms persist

## 2020-04-19 NOTE — ED Triage Notes (Signed)
Patient fell playing soccer last night.  Patient reports putting left hand out to stop fall.   pain in left wrist.  Able to move fingers, unable to rotate forearm.  Patient has an old injury to left forearm.   Left radial pulse 2+.  Patient has been using ice on left wrist

## 2020-04-19 NOTE — ED Notes (Signed)
Patient went to move her car.  Not available when called to intake

## 2022-01-29 IMAGING — DX DG WRIST COMPLETE 3+V*L*
4 series · 4 of 4 positions shown · non-contrast
Comparison: None.

CLINICAL DATA: Pain following fall

EXAM:
LEFT WRIST - COMPLETE 3+ VIEW

[wrist pa]
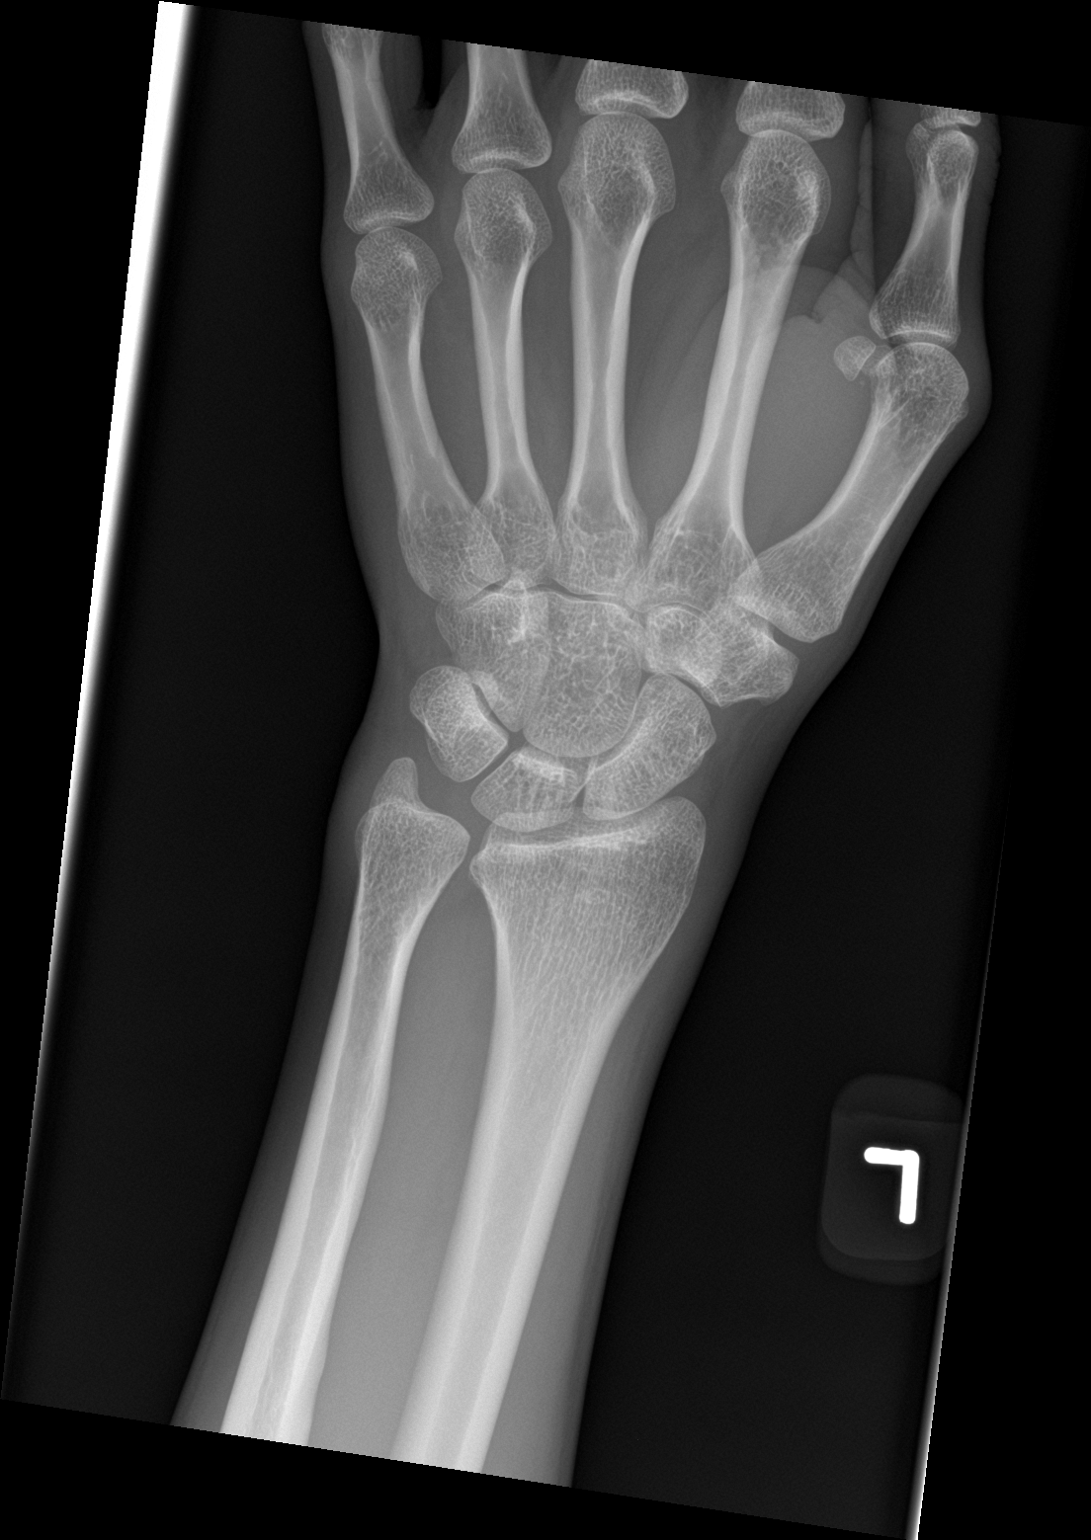

[wrist navicular]
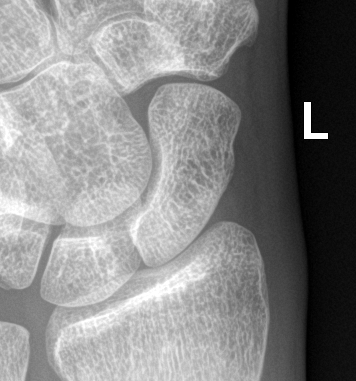

[wrist obl]
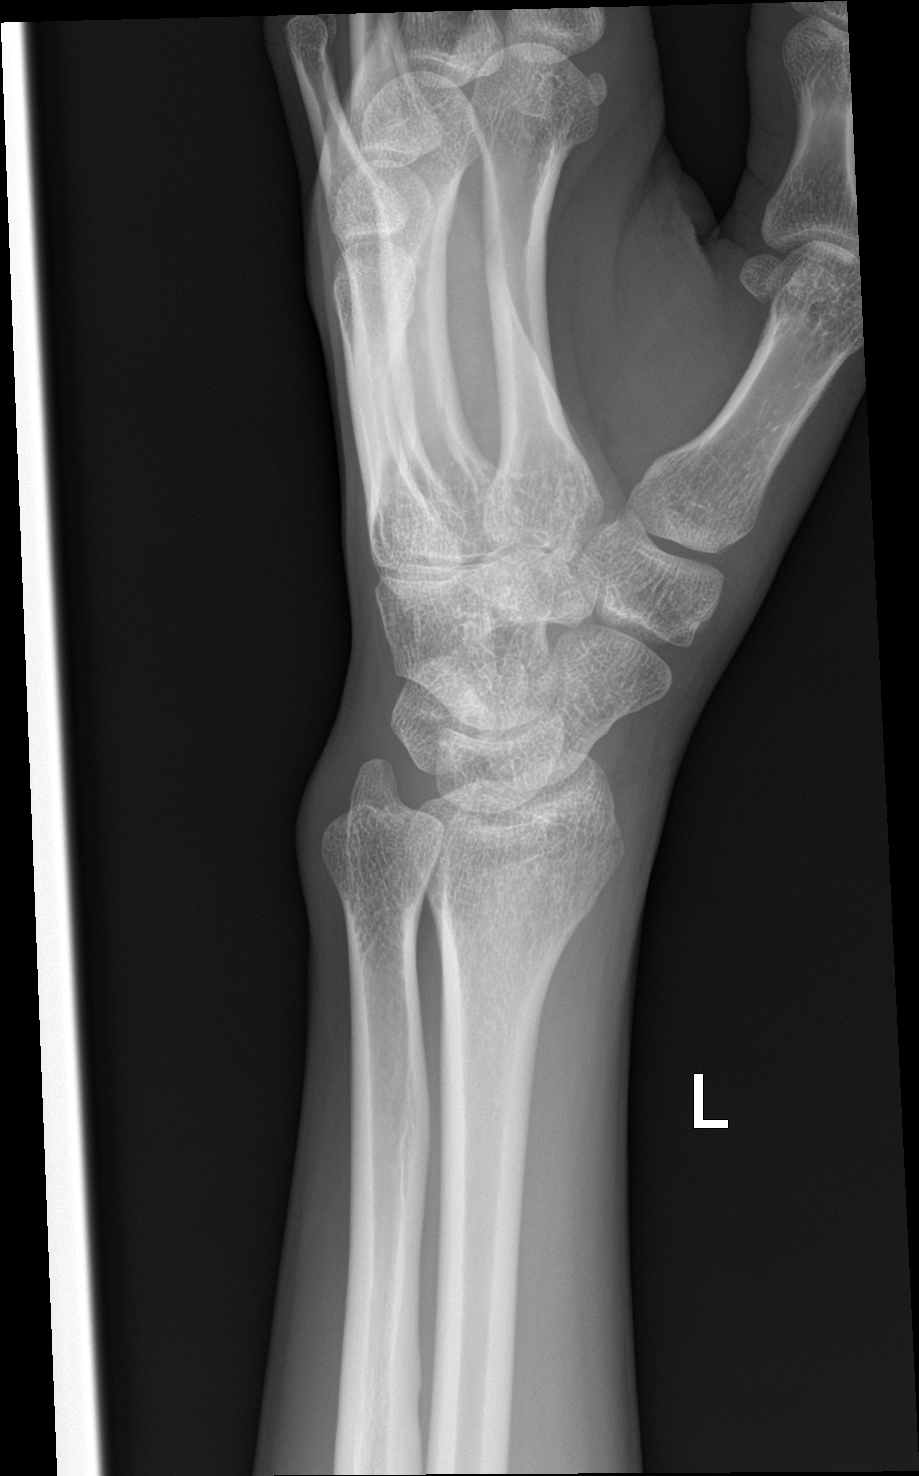

[wrist lat]
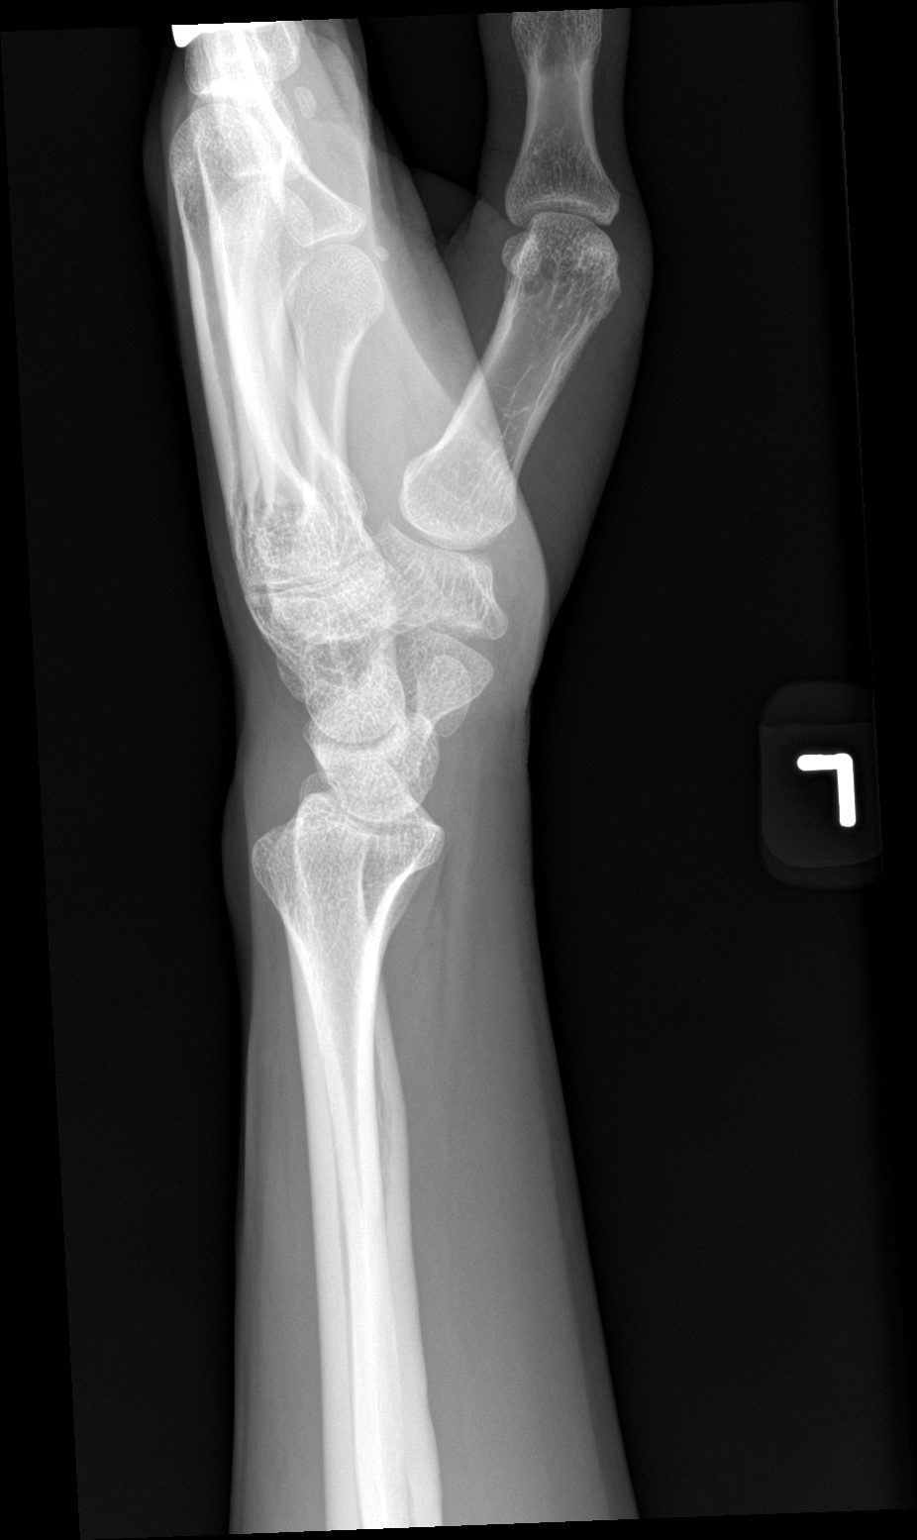

[4 of 4 positions shown; findings below may reference images not displayed]

FINDINGS: Frontal, oblique, lateral, and ulnar deviation scaphoid images were
obtained. No fracture or dislocation. Joint spaces appear normal. No
erosive change.
IMPRESSION: No fracture or dislocation.  No evident arthropathy.
